# Patient Record
Sex: Male | Born: 1975 | Race: White | Hispanic: No | Marital: Single | Smoking: Current some day smoker
Health system: Southern US, Community
[De-identification: ages and names within clinical notes are randomized; demographics above are authoritative.]

## PROBLEM LIST (undated history)

## (undated) DIAGNOSIS — J45909 Unspecified asthma, uncomplicated: Secondary | ICD-10-CM

---

## 2015-09-22 ENCOUNTER — Emergency Department (HOSPITAL_BASED_OUTPATIENT_CLINIC_OR_DEPARTMENT_OTHER): Payer: Self-pay

## 2015-09-22 ENCOUNTER — Encounter (HOSPITAL_BASED_OUTPATIENT_CLINIC_OR_DEPARTMENT_OTHER): Payer: Self-pay

## 2015-09-22 ENCOUNTER — Emergency Department (HOSPITAL_BASED_OUTPATIENT_CLINIC_OR_DEPARTMENT_OTHER)
Admission: EM | Admit: 2015-09-22 | Discharge: 2015-09-22 | Disposition: A | Discharge status: Home / Self Care | Payer: Self-pay | Attending: Emergency Medicine | Admitting: Emergency Medicine

## 2015-09-22 DIAGNOSIS — J45901 Unspecified asthma with (acute) exacerbation: Secondary | ICD-10-CM | POA: Insufficient documentation

## 2015-09-22 DIAGNOSIS — Z79899 Other long term (current) drug therapy: Secondary | ICD-10-CM | POA: Insufficient documentation

## 2015-09-22 DIAGNOSIS — F17200 Nicotine dependence, unspecified, uncomplicated: Secondary | ICD-10-CM | POA: Insufficient documentation

## 2015-09-22 HISTORY — DX: Unspecified asthma, uncomplicated: J45.909

## 2015-09-22 MED ORDER — PREDNISONE 20 MG PO TABS: ORAL_TABLET | ORAL | Status: AC

## 2015-09-22 MED ORDER — AEROCHAMBER PLUS W/MASK MISC
1.0000 | Freq: Once | Status: AC
  Administered 2015-09-22: 1
  Filled 2015-09-22: qty 1

## 2015-09-22 MED ORDER — PREDNISONE 20 MG PO TABS
40.0000 mg | ORAL_TABLET | Freq: Once | ORAL | Status: AC
  Administered 2015-09-22: 40 mg via ORAL
  Filled 2015-09-22: qty 2

## 2015-09-22 MED ORDER — IPRATROPIUM-ALBUTEROL 0.5-2.5 (3) MG/3ML IN SOLN
3.0000 mL | RESPIRATORY_TRACT | Status: DC
  Administered 2015-09-22: 3 mL via RESPIRATORY_TRACT

## 2015-09-22 MED ORDER — PREDNISONE 20 MG PO TABS: ORAL_TABLET | ORAL | Status: DC

## 2015-09-22 MED ORDER — ALBUTEROL SULFATE (2.5 MG/3ML) 0.083% IN NEBU
5.0000 mg | INHALATION_SOLUTION | Freq: Once | RESPIRATORY_TRACT | Status: DC
  Filled 2015-09-22: qty 6

## 2015-09-22 MED ORDER — MAGNESIUM SULFATE 2 GM/50ML IV SOLN
2.0000 g | Freq: Once | INTRAVENOUS | Status: AC
  Administered 2015-09-22: 2 g via INTRAVENOUS
  Filled 2015-09-22: qty 50

## 2015-09-22 MED ORDER — ALBUTEROL SULFATE HFA 108 (90 BASE) MCG/ACT IN AERS
4.0000 | INHALATION_SPRAY | Freq: Once | RESPIRATORY_TRACT | Status: AC
  Administered 2015-09-22: 4 via RESPIRATORY_TRACT
  Filled 2015-09-22: qty 6.7

## 2015-09-22 MED ORDER — IPRATROPIUM-ALBUTEROL 0.5-2.5 (3) MG/3ML IN SOLN
3.0000 mL | RESPIRATORY_TRACT | Status: AC
  Administered 2015-09-22 (×2): 3 mL via RESPIRATORY_TRACT
  Filled 2015-09-22: qty 9

## 2015-09-22 MED ORDER — ALBUTEROL (5 MG/ML) CONTINUOUS INHALATION SOLN
15.0000 mg/h | INHALATION_SOLUTION | Freq: Once | RESPIRATORY_TRACT | Status: AC
  Administered 2015-09-22: 15 mg/h via RESPIRATORY_TRACT
  Filled 2015-09-22: qty 20

## 2015-09-22 MED ORDER — IPRATROPIUM BROMIDE 0.02 % IN SOLN
RESPIRATORY_TRACT | Status: AC
  Filled 2015-09-22: qty 2.5

## 2015-09-22 NOTE — Discharge Instructions (Signed)
User inhaler every 4 hours while awake. 6 puffs. Take your steroids as prescribed. Return for needing to use the inhaler more often than every 4 hours. Asthma, Acute Bronchospasm Acute bronchospasm caused by asthma is also referred to as an asthma attack. Bronchospasm means your air passages become narrowed. The narrowing is caused by inflammation and tightening of the muscles in the air tubes (bronchi) in your lungs. This can make it hard to breathe or cause you to wheeze and cough. CAUSES Possible triggers are:  Animal dander from the skin, hair, or feathers of animals.  Dust mites contained in house dust.  Cockroaches.  Pollen from trees or grass.  Mold.  Cigarette or tobacco smoke.  Air pollutants such as dust, household cleaners, hair sprays, aerosol sprays, paint fumes, strong chemicals, or strong odors.  Cold air or weather changes. Cold air may trigger inflammation. Winds increase molds and pollens in the air.  Strong emotions such as crying or laughing hard.  Stress.  Certain medicines such as aspirin or beta-blockers.  Sulfites in foods and drinks, such as dried fruits and wine.  Infections or inflammatory conditions, such as a flu, cold, or inflammation of the nasal membranes (rhinitis).  Gastroesophageal reflux disease (GERD). GERD is a condition where stomach acid backs up into your esophagus.  Exercise or strenuous activity. SIGNS AND SYMPTOMS   Wheezing.  Excessive coughing, particularly at night.  Chest tightness.  Shortness of breath. DIAGNOSIS  Your health care provider will ask you about your medical history and perform a physical exam. A chest X-ray or blood testing may be performed to look for other causes of your symptoms or other conditions that may have triggered your asthma attack. TREATMENT  Treatment is aimed at reducing inflammation and opening up the airways in your lungs. Most asthma attacks are treated with inhaled medicines. These  include quick relief or rescue medicines (such as bronchodilators) and controller medicines (such as inhaled corticosteroids). These medicines are sometimes given through an inhaler or a nebulizer. Systemic steroid medicine taken by mouth or given through an IV tube also can be used to reduce the inflammation when an attack is moderate or severe. Antibiotic medicines are only used if a bacterial infection is present.  HOME CARE INSTRUCTIONS   Rest.  Drink plenty of liquids. This helps the mucus to remain thin and be easily coughed up. Only use caffeine in moderation and do not use alcohol until you have recovered from your illness.  Do not smoke. Avoid being exposed to secondhand smoke.  You play a critical role in keeping yourself in good health. Avoid exposure to things that cause you to wheeze or to have breathing problems.  Keep your medicines up-to-date and available. Carefully follow your health care provider's treatment plan.  Take your medicine exactly as prescribed.  When pollen or pollution is bad, keep windows closed and use an air conditioner or go to places with air conditioning.  Asthma requires careful medical care. See your health care provider for a follow-up as advised. If you are more than [redacted] weeks pregnant and you were prescribed any new medicines, let your obstetrician know about the visit and how you are doing. Follow up with your health care provider as directed.  After you have recovered from your asthma attack, make an appointment with your outpatient doctor to talk about ways to reduce the likelihood of future attacks. If you do not have a doctor who manages your asthma, make an appointment with a primary  care doctor to discuss your asthma. SEEK IMMEDIATE MEDICAL CARE IF:   You are getting worse.  You have trouble breathing. If severe, call your local emergency services (911 in the [place]  You develop chest pain or discomfort.  You are vomiting.  You are not  able to keep fluids down.  You are coughing up yellow, green, brown, or bloody sputum.  You have a fever and your symptoms suddenly get worse.  You have trouble swallowing. MAKE SURE YOU:   Understand these instructions.  Will watch your condition.  Will get help right away if you are not doing well or get worse.   This information is not intended to replace advice given to you by your health care provider. Make sure you discuss any questions you have with your health care provider.   Document Released: 01/26/2007 Document Revised: 10/16/2013 Document Reviewed: 04/18/2013 Elsevier Interactive Patient Education [employer]2016 [employer]

## 2015-09-22 NOTE — ED Provider Notes (Signed)
Patient's breathing now feeling fine following the magnesium. Patient will be started on prednisone discharge instructions printed. Patient in no acute distress nontoxic.  Vanetta MuldersScott Breckin Savannah, MD 09/22/15 1723

## 2015-09-22 NOTE — ED Notes (Signed)
Reports shortness of breath for weeks.  Reports hx of bronchoasthma.  Pt reports nothing has helped such as what he usually does at home to help.  Pt has nasal flaring and retractions noted in triage.

## 2015-09-22 NOTE — ED Provider Notes (Signed)
CSN: [id number]     Arrival date & time 09/22/15  1348 History   First MD Initiated Contact with Patient 09/22/15 1356     Chief Complaint  Patient presents with  . Shortness of Breath     (Consider location/radiation/quality/duration/timing/severity/associated sxs/prior Treatment) Patient is a 39 y.o. male presenting with shortness of breath. The history is provided by the patient.  Shortness of Breath Severity:  Severe Onset quality:  Gradual Duration:  2 weeks Timing:  Constant Progression:  Worsening Chronicity:  Recurrent Relieved by:  Nothing Worsened by:  Nothing tried Ineffective treatments:  None tried Associated symptoms: wheezing   Associated symptoms: no abdominal pain, no chest pain, no fever, no headaches, no rash and no vomiting     39 yo M with a chief complaint shortness of breath. This started about 2 weeks ago. Slowly worsening. Denying fever or chills. Has a history of asthma but has run out of his inhaler sometime ago. Patient has not seen a doctor in quite some time. Patient denies any medical history other than childhood asthma. Denies sick contacts.  Past Medical History  Diagnosis Date  . Asthma    History reviewed. No pertinent past surgical history. No family history on file. Social History  Substance Use Topics  . Smoking status: Current Some Day Smoker  . Smokeless tobacco: None  . Alcohol Use: Yes    Review of Systems  Constitutional: Negative for fever and chills.  HENT: Negative for congestion and facial swelling.   Eyes: Negative for discharge and visual disturbance.  Respiratory: Positive for shortness of breath and wheezing.   Cardiovascular: Negative for chest pain and palpitations.  Gastrointestinal: Negative for vomiting, abdominal pain and diarrhea.  Musculoskeletal: Negative for myalgias and arthralgias.  Skin: Negative for color change and rash.  Neurological: Negative for tremors, syncope and headaches.   Psychiatric/Behavioral: Negative for confusion and dysphoric mood.      Allergies  Review of patient's allergies indicates no known allergies.  Home Medications   Prior to Admission medications   Medication Sig Start Date End Date Taking? Authorizing Provider  albuterol (PROVENTIL HFA;VENTOLIN HFA) 108 (90 BASE) MCG/ACT inhaler Inhale into the lungs every 6 (six) hours as needed for wheezing or shortness of breath.   Yes Historical Provider, MD  predniSONE (DELTASONE) 20 MG tablet 2 tabs po daily x 4 days 09/22/15   Vanetta MuldersScott Zackowski, MD   BP 112/81 mmHg  Pulse 102  Temp(Src) 98.4 F (36.9 C) (Oral)  Resp 20  Ht 5\' 9"  (1.753 m)  Wt 180 lb (81.647 kg)  BMI 26.57 kg/m2  SpO2 94% Physical Exam  Constitutional: He is oriented to person, place, and time. He appears well-developed and well-nourished.  HENT:  Head: Normocephalic and atraumatic.  Eyes: EOM are normal. Pupils are equal, round, and reactive to light.  Neck: Normal range of motion. Neck supple. No JVD present.  Cardiovascular: Normal rate and regular rhythm.  Exam reveals no gallop and no friction rub.   No murmur heard. Pulmonary/Chest: He is in respiratory distress. He has wheezes.  Tachypnea, patient with some mild nasal flaring. Diminished breath sounds bilaterally with diffuse wheezing, prolonged expiration.  Abdominal: He exhibits no distension. There is no rebound and no guarding.  Musculoskeletal: Normal range of motion.  Neurological: He is alert and oriented to person, place, and time.  Skin: No rash noted. No pallor.  Psychiatric: He has a normal mood and affect. His behavior is normal.  Nursing note and vitals reviewed.  ED Course  Procedures (including critical care time) Labs Review Labs Reviewed - No data to display  Imaging Review Dg Chest 2 View  09/22/2015  CLINICAL DATA:  Cough, shortness of breath and wheezing for 1 week, history smoking, asthma EXAM: CHEST  2 VIEW COMPARISON:  None  FINDINGS: Normal heart size, mediastinal contours, and pulmonary vascularity. Azygos fissure noted. Minimal peribronchial thickening. No acute infiltrate, pleural effusion or pneumothorax. Bones unremarkable. IMPRESSION: Minimal bronchitic changes without infiltrate. Electronically Signed   By: Ulyses Southward M.D.   On: 09/22/2015 14:09   I have personally reviewed and evaluated these images and lab results as part of my medical decision-making.   EKG Interpretation None      MDM   Final diagnoses:  Asthma exacerbation    39 yo M with a chief complaint shortness of breath. This been going on for about 2 weeks. Trying some inhalers at home but not helping. Patient denying fevers or chills. Patient in significant respiratory distress on initial exam. Only able to speak 2 word sentences. Given 3 DuoNeb's back-to-back with significant improvement. Patient however continues to be tight and wheezing. Placed on a continuous albuterol treatment. We'll give him IV magnesium.  Patient with continued wheezing, hypoxia likely secondary to vq mismatch post inhalers.  Patient feeling much better requesting discharge home.  Discussed admission with continued wheezing and hypoxia, but would like d/c.  CRITICAL CARE Performed by: Rae Roam   Total critical care time: 40 minutes  Critical care time was exclusive of separately billable procedures and treating other patients.  Critical care was necessary to treat or prevent imminent or life-threatening deterioration.  Critical care was time spent personally by me on the following activities: development of treatment plan with patient and/or surrogate as well as nursing, discussions with consultants, evaluation of patient's response to treatment, examination of patient, obtaining history from patient or surrogate, ordering and performing treatments and interventions, ordering and review of laboratory studies, ordering and review of radiographic  studies, pulse oximetry and re-evaluation of patient's condition.  Awaiting finished mg infusion, care turned over to Dr. Deretha Emory.    I have discussed the diagnosis/risks/treatment options with the patient and family and believe the pt to be eligible for discharge home to follow-up with PCP. We also discussed returning to the ED immediately if new or worsening sx occur. We discussed the sx which are most concerning (e.g., sudden worsening sob, fever, having to use inhaler more than every 4 hours) that necessitate immediate return. Medications administered to the patient during their visit and any new prescriptions provided to the patient are listed below.  Medications given during this visit Medications  predniSONE (DELTASONE) tablet 40 mg (40 mg Oral Given 09/22/15 1521)  ipratropium (ATROVENT) 0.02 % nebulizer solution (  Return to [facility] 09/22/15 1400)  ipratropium-albuterol (DUONEB) 0.5-2.5 (3) MG/3ML nebulizer solution 3 mL (0 mLs Nebulization Duplicate 09/22/15 1411)  albuterol (PROVENTIL,VENTOLIN) solution continuous neb (15 mg/hr Nebulization Given 09/22/15 1436)  magnesium sulfate IVPB 2 g 50 mL (0 g Intravenous Stopped 09/22/15 1728)  albuterol (PROVENTIL HFA;VENTOLIN HFA) 108 (90 BASE) MCG/ACT inhaler 4 puff (4 puffs Inhalation Given 09/22/15 1637)  aerochamber plus with mask device 1 each (1 each Other Given 09/22/15 1638)    Discharge Medication List as of 09/22/2015  5:22 PM      The patient appears reasonably screen and/or stabilized for discharge and I doubt any other medical condition or other [facility] requiring further screening, evaluation, or treatment in  the ED at this time prior to discharge.     Melene Plan, DO 09/23/15 1353

## 2017-04-17 IMAGING — CR DG CHEST 2V
2 series · 2 of 2 positions shown · non-contrast
Comparison: None

CLINICAL DATA: Cough, shortness of breath and wheezing for 1 week,
history smoking, asthma

EXAM:
CHEST  2 VIEW

[w chest pa]
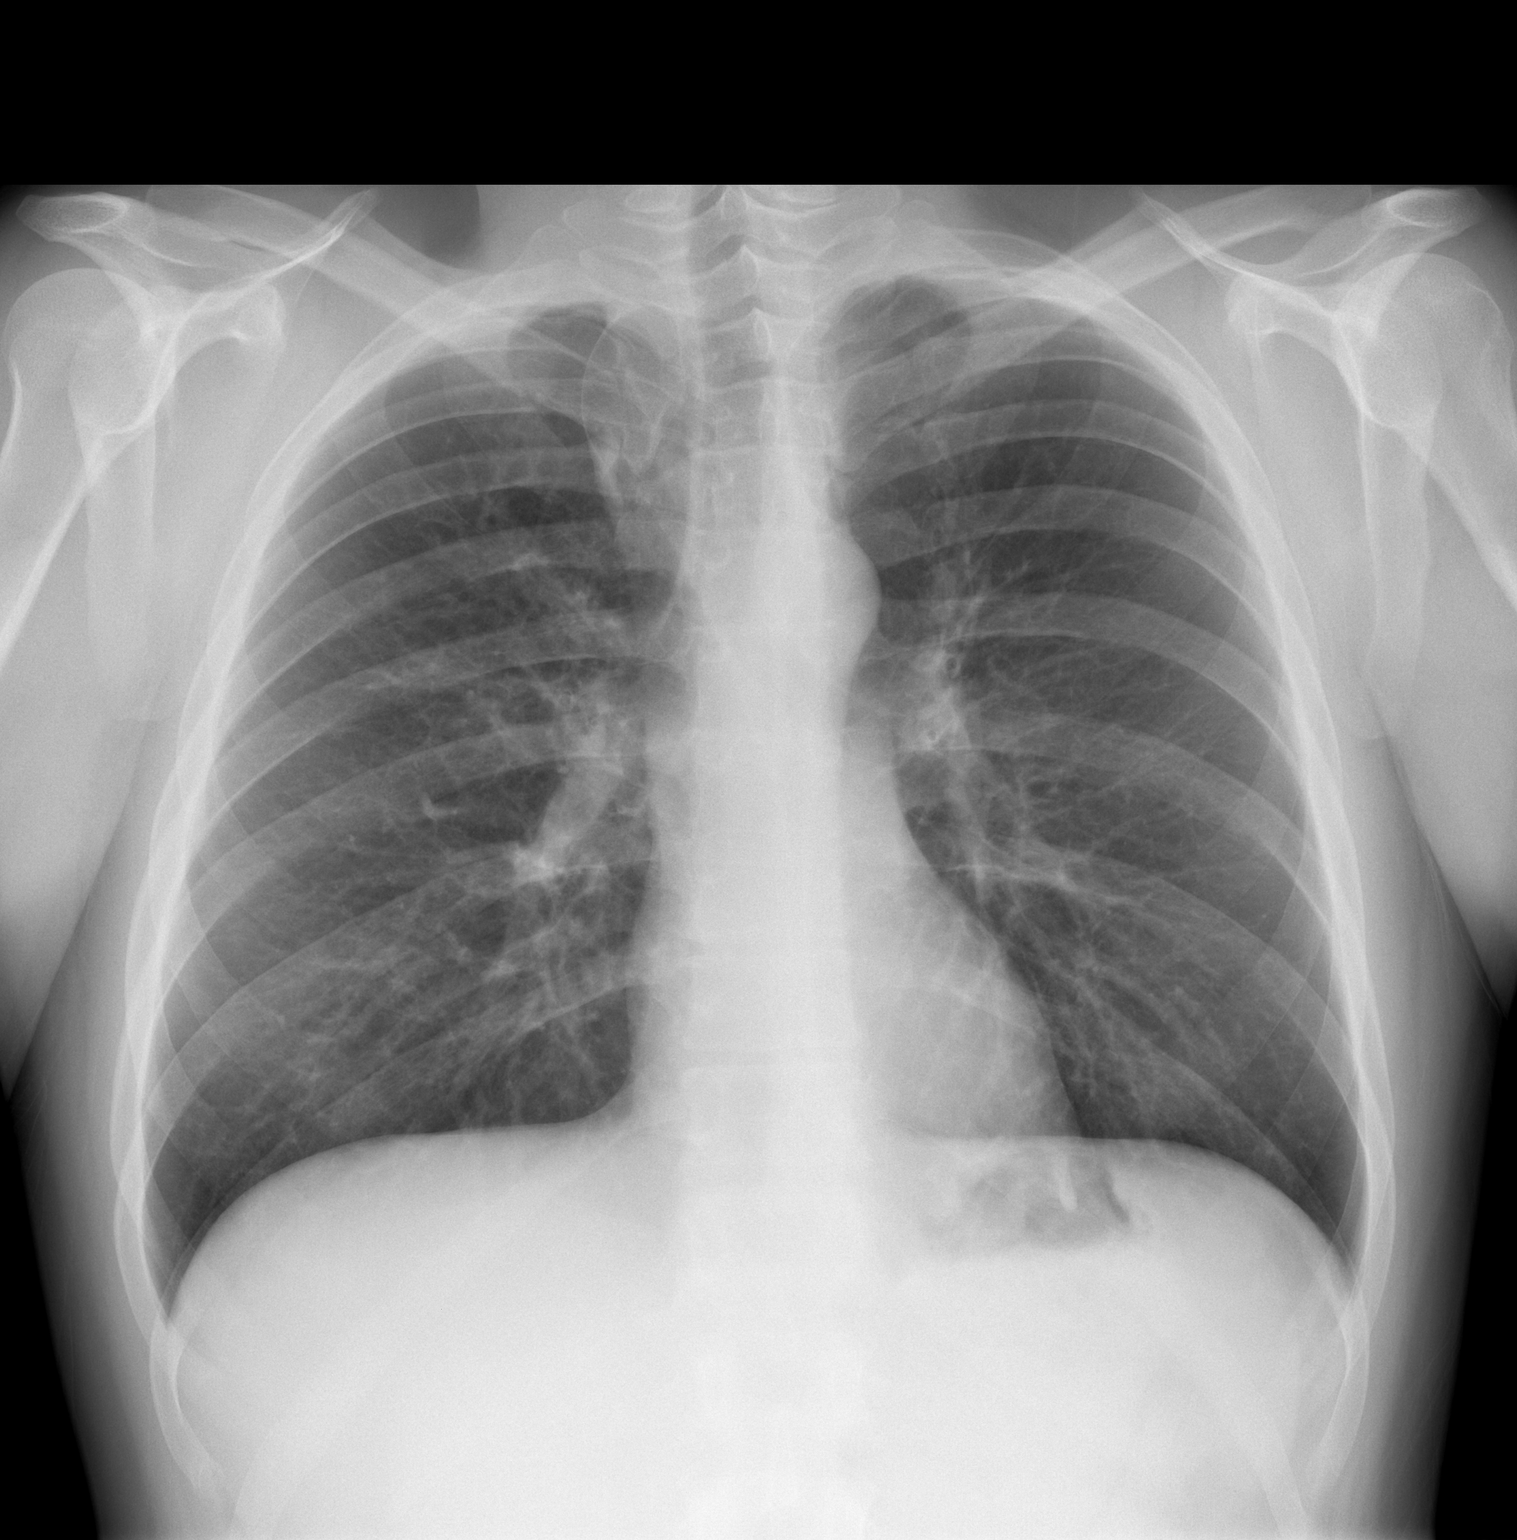

[w chest lat]
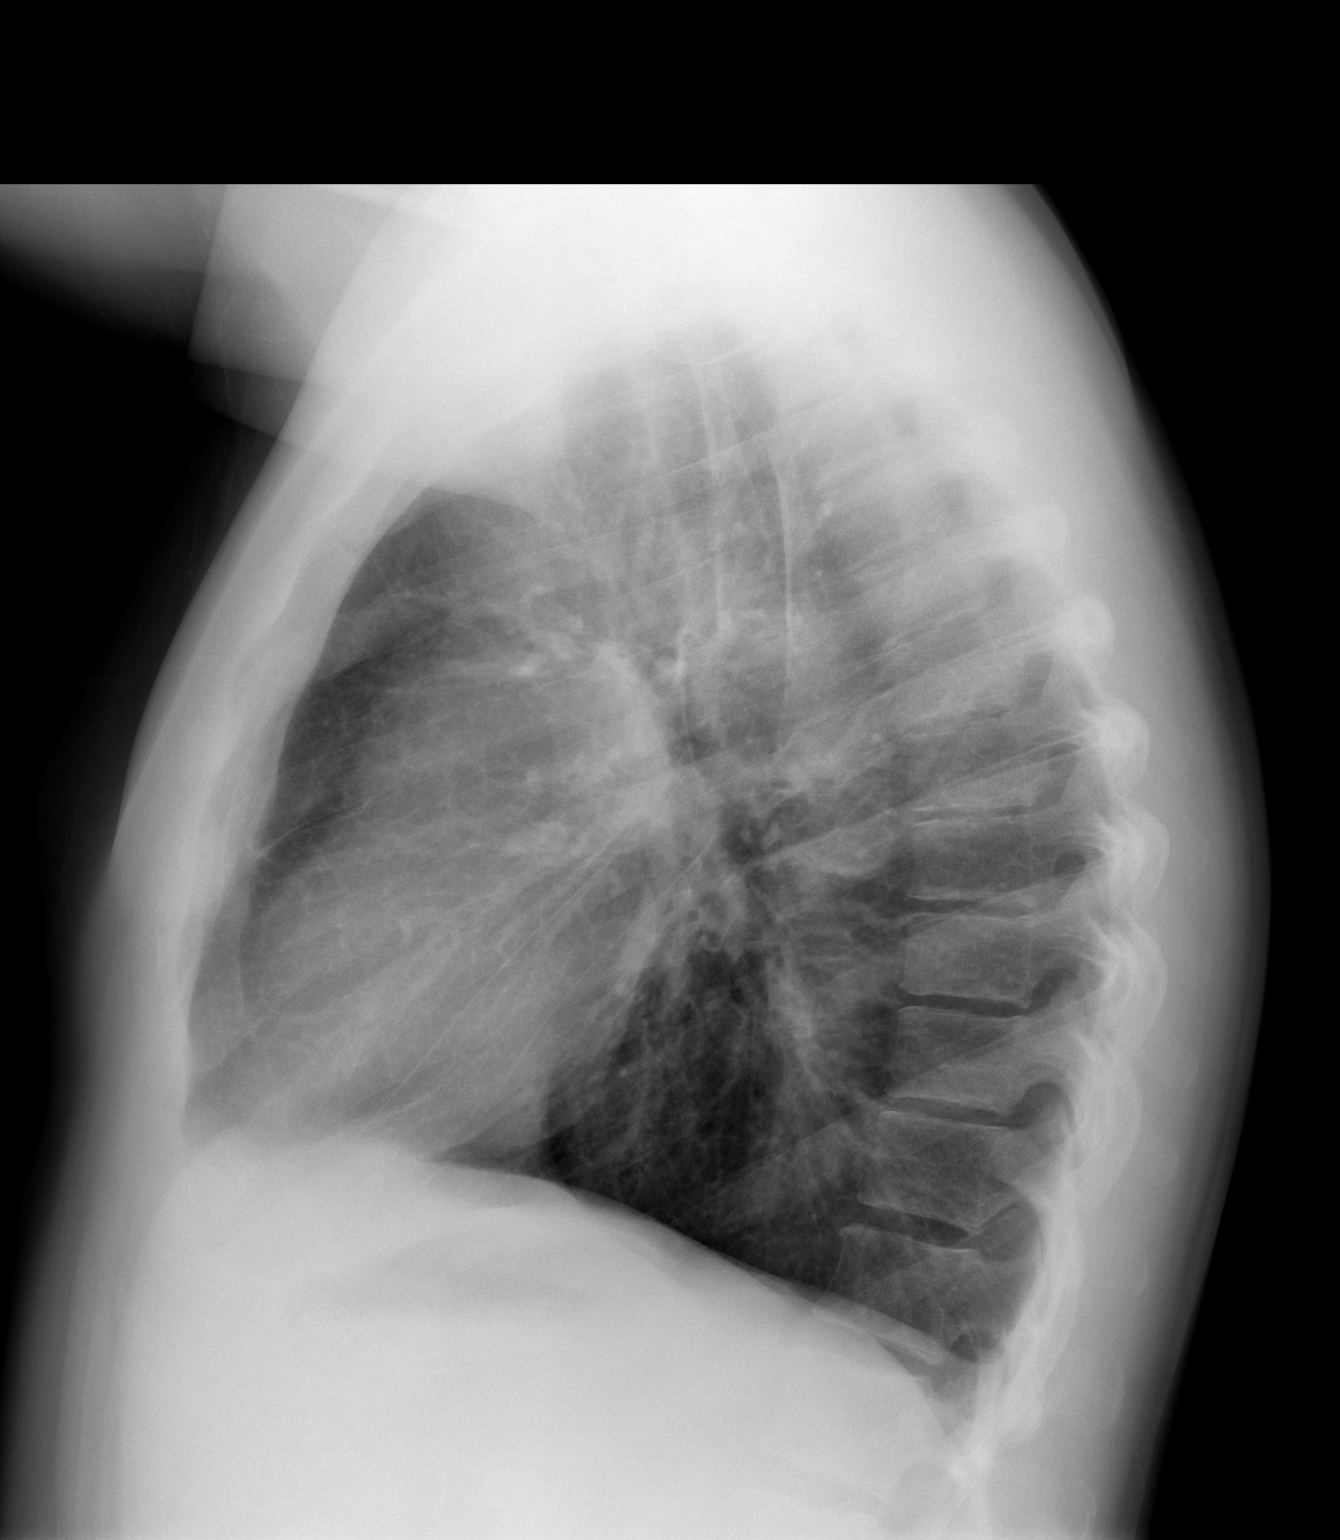

[2 of 2 positions shown; findings below may reference images not displayed]

FINDINGS: Normal heart size, mediastinal contours, and pulmonary vascularity.

Azygos fissure noted.

Minimal peribronchial thickening.

No acute infiltrate, pleural effusion or pneumothorax.

Bones unremarkable.
IMPRESSION: Minimal bronchitic changes without infiltrate.
